# Patient Record
Sex: Male | Born: 1985 | Race: Black or African American | Hispanic: No | Marital: Married | Smoking: Never smoker
Health system: Southern US, Community
[De-identification: ages and names within clinical notes are randomized; demographics above are authoritative.]

---

## 2016-08-03 LAB — TSH: TSH: 0.01 — AB (ref <=5.90)

## 2016-12-14 LAB — TSH: TSH: 0.01 — AB (ref <=5.90)

## 2016-12-25 ENCOUNTER — Ambulatory Visit (INDEPENDENT_AMBULATORY_CARE_PROVIDER_SITE_OTHER): Payer: BLUE CROSS/BLUE SHIELD | Admitting: "Endocrinology

## 2016-12-25 VITALS — BP 138/88 | HR 62 | Wt 220.0 lb

## 2016-12-25 DIAGNOSIS — E0590 Thyrotoxicosis, unspecified without thyrotoxic crisis or storm: Secondary | ICD-10-CM | POA: Insufficient documentation

## 2016-12-25 NOTE — Progress Notes (Signed)
Subjective:    Patient ID: Nicholas Fuller, male    DOB: March 20, 1986, PCP Renaldo HarrisonAddis, [name], DO   No past medical history on file. No past surgical history on file. Social History   Social History  . Marital status: Married    Spouse name: N/A  . Number of children: N/A  . Years of education: N/A   Social History Main Topics  . Smoking status: Not on file  . Smokeless tobacco: Not on file  . Alcohol use Not on file  . Drug use: Unknown  . Sexual activity: Not on file   Other Topics Concern  . Not on file   Social History Narrative  . No narrative on file   Outpatient Encounter Prescriptions as of 12/25/2016  Medication Sig  . metoprolol succinate (TOPROL-XL) 25 MG 24 hr tablet Take 0.5 tablets by mouth daily.  . [DISCONTINUED] methimazole (TAPAZOLE) 10 MG tablet Take 2 tablets by mouth daily.   No facility-administered encounter medications on file as of 12/25/2016.    ALLERGIES: No Known Allergies  VACCINATION STATUS:  There is no immunization history on file for this patient.  HPI 31 year old male patient with medical history as above. He is being seen in consultation for hyperthyroidism requested by Dr. Earl LagosAddid. - He reports that he was diagnosed with hyperthyroidism in 2015 since when he was treated continuously with methimazole currently at 10 mg by mouth twice a day and metoprolol 12.5 mg by mouth daily. -He did have symptoms of weight loss, palpitations, tremor, sweating/heat intolerance before he was initiated on therapy. His thyroid function tests remain abnormal however, his most recent thyroid function test on 12/14/2016 showed TSH 0.01, free T4 index 3.0, total T4 10.6. His labs from 08/03/2016 was significant for suppressed TSH, free T4 index was 5.9, total T4 was 16.8. - Patient reports fatigue and fluctuating energy level. - He denies any family history of thyroid dysfunction. - He didn't have thyroid ultrasound but no thyroid uptake and scan in his  records.  Review of Systems  Constitutional: + Currently reports steady body weight., + fatigue, no subjective hyperthermia, no subjective hypothermia Eyes: no blurry vision, no xerophthalmia ENT: no sore throat, no nodules palpated in throat, no dysphagia/odynophagia, no hoarseness Cardiovascular: no Chest Pain, no Shortness of Breath, no palpitations, no leg swelling Respiratory: no cough, no SOB Gastrointestinal: no Nausea/Vomiting/Diarhhea Musculoskeletal: no muscle/joint aches Skin: no rashes Neurological: no tremors, no numbness, no tingling, no dizziness Psychiatric: no depression, no anxiety  Objective:    BP 138/88 (BP Location: Left Arm, Patient Position: Sitting, Cuff Size: Large)   Pulse 62   Wt 220 lb (99.8 kg)   SpO2 98%   Wt Readings from Last 3 Encounters:  12/25/16 220 lb (99.8 kg)    Physical Exam  Constitutional:  Slightly over weight for height, not in acute distress, normal state of mind Eyes: PERRLA, EOMI, no exophthalmos ENT: moist mucous membranes, + mild thyromegaly, no cervical lymphadenopathy Cardiovascular: normal precordial activity, Regular Rate and Rhythm, no Murmur/Rubs/Gallops Respiratory:  adequate breathing efforts, no gross chest deformity, Clear to auscultation bilaterally Gastrointestinal: abdomen soft, Non -tender, No distension, Bowel Sounds present Musculoskeletal: no gross deformities, strength intact in all four extremities Skin: moist, warm, no rashes Neurological: no tremor with outstretched hands, Deep tendon reflexes normal in all four extremities.  Recent Results (from the past 2160 hour(s))  TSH     Status: Abnormal   Collection Time: 12/14/16 12:00 AM  Result Value Ref Range  TSH 0.01 (A) 0.41 - 5.90    Comment: Free T4 index 3.0, total T4 10.6    Thyroid ultrasound from 10/17/2016 showed right lobe 5.8 cm x 2.1 cmx 3 cm. This lobe was found to be diffusely heterogeneous with nodular echotexture however no discrete focal  abnormality. 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 There is diffuse increased vascularity throughout the thyroid.  Assessment & Plan:   1. Hyperthyroidism - This patient is being seen at the kind request of Dr. Hart RobinsonsAddis. - I have reviewed his available thyroid records and clinically evaluated this patient. - Although he has responded to therapy with methimazole (has taken since 2015), he will likely require definitive ablative treatment if thyroid uptake and scan is suggestive of primary hyperthyroidism. - I have discussed is options of therapy and agrees with plan to obtain thyroid uptake and scan and he will return in 2 weeks with results. In preparation for thyroid uptake and scan, I have advised him to discontinue methimazole for 5 days. -He'll continue on low-dose metoprolol 12.5 mg by mouth daily for rate control.  - 45 minutes of time was spent on the care of this patient , 50% of which was applied for counseling on untreated  hyperthyroidism and its complications.  - I advised patient to maintain close follow up with Renaldo HarrisonAddis, [name], DO for primary care needs. Follow up plan: Return in about 2 weeks (around 01/08/2017) for follow up with thyroid uptake and scan.  Marquis LunchGebre Giovanne Nickolson, MD Phone: [phone]  Fax: [phone]   12/25/2016, 5:44 PM

## 2016-12-28 ENCOUNTER — Other Ambulatory Visit: Payer: Self-pay | Admitting: "Endocrinology

## 2016-12-28 DIAGNOSIS — E0590 Thyrotoxicosis, unspecified without thyrotoxic crisis or storm: Secondary | ICD-10-CM

## 2017-01-02 ENCOUNTER — Encounter (HOSPITAL_COMMUNITY): Payer: BLUE CROSS/BLUE SHIELD

## 2017-01-03 ENCOUNTER — Encounter (HOSPITAL_COMMUNITY): Payer: BLUE CROSS/BLUE SHIELD

## 2017-01-03 ENCOUNTER — Ambulatory Visit: Payer: BLUE CROSS/BLUE SHIELD | Admitting: "Endocrinology

## 2017-01-04 ENCOUNTER — Ambulatory Visit: Payer: BLUE CROSS/BLUE SHIELD | Admitting: "Endocrinology

## 2017-01-08 ENCOUNTER — Encounter (HOSPITAL_COMMUNITY)
Admission: RE | Admit: 2017-01-08 | Discharge: 2017-01-08 | Disposition: A | Discharge status: Home / Self Care | Payer: BLUE CROSS/BLUE SHIELD | Source: Ambulatory Visit | Attending: "Endocrinology | Admitting: "Endocrinology

## 2017-01-08 ENCOUNTER — Encounter (HOSPITAL_COMMUNITY): Payer: Self-pay

## 2017-01-08 DIAGNOSIS — E0590 Thyrotoxicosis, unspecified without thyrotoxic crisis or storm: Secondary | ICD-10-CM | POA: Insufficient documentation

## 2017-01-08 MED ORDER — SODIUM IODIDE I-123 7.4 MBQ CAPS
400.0000 | ORAL_CAPSULE | Freq: Once | ORAL | Status: AC
  Administered 2017-01-08: 364 via ORAL

## 2017-01-09 ENCOUNTER — Encounter (HOSPITAL_COMMUNITY)
Admission: RE | Admit: 2017-01-09 | Discharge: 2017-01-09 | Disposition: A | Discharge status: Home / Self Care | Payer: BLUE CROSS/BLUE SHIELD | Source: Ambulatory Visit | Attending: "Endocrinology | Admitting: "Endocrinology

## 2017-01-09 ENCOUNTER — Encounter (HOSPITAL_COMMUNITY): Payer: Self-pay

## 2017-01-09 DIAGNOSIS — E0590 Thyrotoxicosis, unspecified without thyrotoxic crisis or storm: Secondary | ICD-10-CM | POA: Diagnosis present

## 2017-01-19 ENCOUNTER — Encounter (HOSPITAL_COMMUNITY): Payer: BLUE CROSS/BLUE SHIELD

## 2017-01-30 ENCOUNTER — Ambulatory Visit (INDEPENDENT_AMBULATORY_CARE_PROVIDER_SITE_OTHER): Payer: BLUE CROSS/BLUE SHIELD | Admitting: "Endocrinology

## 2017-01-30 ENCOUNTER — Encounter: Payer: Self-pay | Admitting: "Endocrinology

## 2017-01-30 VITALS — BP 134/83 | HR 64 | Ht 69.0 in | Wt 220.0 lb

## 2017-01-30 DIAGNOSIS — E0590 Thyrotoxicosis, unspecified without thyrotoxic crisis or storm: Secondary | ICD-10-CM

## 2017-01-30 NOTE — Progress Notes (Signed)
Subjective:    Patient ID: Nicholas Fuller, male    DOB: [birth year], PCP Nicholas Fuller, Nicholas Fuller, Nicholas Fuller   History reviewed. No pertinent past medical history. History reviewed. No pertinent surgical history. Social History   Social History  . Marital status: Married    Spouse name: N/A  . Number of children: N/A  . Years of education: N/A   Social History Main Topics  . Smoking status: Never Smoker  . Smokeless tobacco: Never Used  . Alcohol use Yes     Comment: Ocassional  . Drug use: No  . Sexual activity: Not Asked   Other Topics Concern  . None   Social History Narrative  . None   Outpatient Encounter Prescriptions as of 01/30/2017  Medication Sig  . metoprolol succinate (TOPROL-XL) 25 MG 24 hr tablet Take 0.5 tablets by mouth daily.   No facility-administered encounter medications on file as of 01/30/2017.    ALLERGIES: No Known Allergies  VACCINATION STATUS:  There is no immunization history on file for this patient.  HPI 31 year old male patient with medical history as above. He is being seen in follow-up for hyperthyroidism . - He reports that he was diagnosed with hyperthyroidism in 2015 since when he was treated continuously with methimazole  until last visit. He remains on  metoprolol 12.5 mg by mouth daily. -He did have symptoms of weight loss, palpitations, tremor, sweating/heat intolerance before he was initiated on therapy. - The symptoms have largely subsided.    His thyroid function tests remain abnormal however, his most recent thyroid function test on 12/14/2016 showed TSH 0.01, free T4 index 3.0, total T4 10.6. His labs from 08/03/2016 was significant for suppressed TSH, free T4 index was 5.9, total T4 was 16.8. - His recent thyroid uptake and scan was unremarkable.  - He denies any family history of thyroid dysfunction.  Review of Systems  Constitutional: + Currently reports steady body weight., + fatigue, no subjective hyperthermia, no subjective  hypothermia Eyes: no blurry vision, no xerophthalmia ENT: no sore throat, no nodules palpated in throat, no dysphagia/odynophagia, no hoarseness Cardiovascular: no Chest Pain, no Shortness of Breath, no palpitations, no leg swelling Respiratory: no cough, no SOB Gastrointestinal: no Nausea/Vomiting/Diarhhea Musculoskeletal: no muscle/joint aches Skin: no rashes Neurological: no tremors, no numbness, no tingling, no dizziness Psychiatric: no depression, no anxiety  Objective:    BP 134/83   Pulse 64   Ht 5\' 9"  (1.753 m)   Wt 220 lb (99.8 kg)   BMI 32.49 kg/m   Wt Readings from Last 3 Encounters:  01/30/17 220 lb (99.8 kg)  12/25/16 220 lb (99.8 kg)    Physical Exam  Constitutional:  Slightly over weight for height, not in acute distress, normal state of mind Eyes: PERRLA, EOMI, no exophthalmos ENT: moist mucous membranes, + mild thyromegaly, no cervical lymphadenopathy Cardiovascular: normal precordial activity, Regular Rate and Rhythm, no Murmur/Rubs/Gallops Respiratory:  adequate breathing efforts, no gross chest deformity, Clear to auscultation bilaterally Gastrointestinal: abdomen soft, Non -tender, No distension, Bowel Sounds present Musculoskeletal: no gross deformities, strength intact in all four extremities Skin: moist, warm, no rashes Neurological: no tremor with outstretched hands, Deep tendon reflexes normal in all four extremities.  Recent Results (from the past 2160 hour(s))  TSH     Status: Abnormal   Collection Time: 12/14/16 12:00 AM  Result Value Ref Range   TSH 0.01 (A) 0.41 - 5.90    Comment: Free T4 index 3.0, total T4 10.6    Thyroid  ultrasound from 10/17/2016 showed right lobe 5.8 cm x 2.1 cmx 3 cm. This lobe was found to be diffusely heterogeneous with nodular echotexture however no discrete focal abnormality. 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 There is diffuse increased  vascularity throughout the thyroid.  01/09/2017 thyroid uptake and scan showed 26.9% (normal 10-30 percent).  Assessment & Plan:   1. Hyperthyroidism - I have reviewed his available thyroid records and clinically evaluated this patient. - He has responded to therapy with methimazole (has taken since 2015). - his thyroid uptake and scan from 01/09/2017 is unremarkable, at 26. 9% (normal 10-30 percent).  he will not require definitive ablative treatment for now. - However, he will be closely followed with repeat thyroid function test in 3 months with office visit. -He'll continue on low-dose metoprolol 12.5 mg by mouth daily for rate control.  - I advised patient to maintain close follow up with Nicholas Fuller, Nicholas Fuller for primary care needs. Follow up plan: Return in about 3 months (around 05/01/2017) for follow up with pre-visit labs.  Nicholas Lunch, MD Phone: [phone]  Fax: [phone]  This note was partially dictated with voice recognition software. Similar sounding words can be transcribed inadequately or may not  be corrected upon review.  01/30/2017, 3:41 PM

## 2017-05-01 ENCOUNTER — Ambulatory Visit: Payer: BLUE CROSS/BLUE SHIELD | Admitting: "Endocrinology

## 2017-05-02 ENCOUNTER — Encounter: Payer: Self-pay | Admitting: "Endocrinology

## 2017-07-04 ENCOUNTER — Encounter: Payer: Self-pay | Admitting: "Endocrinology

## 2017-07-04 ENCOUNTER — Ambulatory Visit: Payer: BLUE CROSS/BLUE SHIELD | Admitting: "Endocrinology

## 2017-08-03 ENCOUNTER — Other Ambulatory Visit: Payer: Self-pay | Admitting: "Endocrinology

## 2017-08-03 LAB — TSH: TSH: 0.01 — AB (ref <=5.90)

## 2017-08-05 LAB — T4, FREE: FREE T4: 4.99 ng/dL — AB (ref 0.82–1.77)

## 2017-08-05 LAB — T3, FREE: T3 FREE: 23.8 pg/mL — AB (ref 2.0–4.4)

## 2017-08-05 LAB — TSH: TSH: <0.006 u[IU]/mL — ABNORMAL LOW (ref 0.450–4.500)

## 2017-08-05 LAB — THYROGLOBULIN ANTIBODY: Thyroglobulin Antibody: 63.4 IU/mL — ABNORMAL HIGH (ref 0.0–0.9)

## 2017-08-05 LAB — THYROID PEROXIDASE ANTIBODY: THYROID PEROXIDASE ANTIBODY: 583 [IU]/mL — AB (ref 0–34)

## 2017-08-06 ENCOUNTER — Ambulatory Visit (INDEPENDENT_AMBULATORY_CARE_PROVIDER_SITE_OTHER): Payer: BLUE CROSS/BLUE SHIELD | Admitting: "Endocrinology

## 2017-08-06 ENCOUNTER — Encounter: Payer: Self-pay | Admitting: "Endocrinology

## 2017-08-06 VITALS — BP 139/83 | HR 102 | Ht 69.0 in | Wt 205.0 lb

## 2017-08-06 DIAGNOSIS — E0590 Thyrotoxicosis, unspecified without thyrotoxic crisis or storm: Secondary | ICD-10-CM | POA: Diagnosis not present

## 2017-08-06 MED ORDER — PREDNISONE 20 MG PO TABS: 20.0000 mg | ORAL_TABLET | Freq: Every day | ORAL | 0 refills | Status: DC

## 2017-08-06 MED ORDER — METOPROLOL SUCCINATE ER 25 MG PO TB24: 25.0000 mg | ORAL_TABLET | Freq: Two times a day (BID) | ORAL | 2 refills | Status: DC

## 2017-08-06 NOTE — Progress Notes (Signed)
Subjective:    Patient ID: Nicholas Fuller, male    DOB: [birth year], PCP Renaldo Harrison, DO   History reviewed. No pertinent past medical history. History reviewed. No pertinent surgical history. Social History   Socioeconomic History  . Marital status: Married    Spouse name: None  . Number of children: None  . Years of education: None  . Highest education level: None  Social Needs  . Financial resource strain: None  . Food insecurity - worry: None  . Food insecurity - inability: None  . Transportation needs - medical: None  . Transportation needs - non-medical: None  Occupational History  . None  Tobacco Use  . Smoking status: Never Smoker  . Smokeless tobacco: Never Used  Substance and Sexual Activity  . Alcohol use: Yes    Comment: Ocassional  . Drug use: No  . Sexual activity: None  Other Topics Concern  . None  Social History Narrative  . None   Outpatient Encounter Medications as of 08/06/2017  Medication Sig  . metoprolol succinate (TOPROL-XL) 25 MG 24 hr tablet Take 1 tablet (25 mg total) by mouth 2 (two) times daily.  . predniSONE (DELTASONE) 20 MG tablet Take 1 tablet (20 mg total) by mouth daily with breakfast.  . [DISCONTINUED] metoprolol succinate (TOPROL-XL) 25 MG 24 hr tablet Take 25 tablets by mouth 2 (two) times daily.   No facility-administered encounter medications on file as of 08/06/2017.    ALLERGIES: No Known Allergies  VACCINATION STATUS:  There is no immunization history on file for this patient.  HPI 32 year old male patient with medical history as above. He is being seen in follow-up for hyperthyroidism . -  - He reports that he was diagnosed with hyperthyroidism in 2015 since when he was treated continuously with methimazole until July 2018.  -Based on his thyroid uptake and scan which showed mild Graves' disease in August 2018,  thyroid  ablation was recommended.  However, patient did not show up for his procedure and did not return  for office visit.  -He noticed symptoms return including palpitations, sleep disturbance, tremor, weight loss of 15 pounds.  -He remains on Toprol 12.5 mg p.o. daily.    His most recent labs showed significantly elevated free T4 of 4.9 9, TSH suppressed at  <0.006, and significantly elevated antithyroid antibodies.   - He denies any family history of thyroid dysfunction.  Review of Systems  Constitutional: + Lost 15 pounds since last visit, + fatigue, + subjective hyperthermia Eyes: no blurry vision, no xerophthalmia ENT: no sore throat, no nodules palpated in throat, no dysphagia/odynophagia, no hoarseness Cardiovascular: no Chest Pain, no Shortness of Breath, + palpitations, no leg swelling Respiratory: no cough, no SOB Gastrointestinal: no Nausea/Vomiting/Diarhhea Musculoskeletal: no muscle/joint aches Skin: no rashes Neurological: + tremors, no numbness, no tingling, no dizziness Psychiatric: no depression, + anxiety  Objective:    BP 139/83   Pulse (!) 102   Ht 5\' 9"  (1.753 m)   Wt 205 lb (93 kg)   BMI 30.27 kg/m   Wt Readings from Last 3 Encounters:  08/06/17 205 lb (93 kg)  01/30/17 220 lb (99.8 kg)  12/25/16 220 lb (99.8 kg)    Physical Exam  Constitutional:  + Anxious state of mind, not in acute distress.   Eyes: PERRLA, EOMI, no exophthalmos ENT: moist mucous membranes, + mild thyromegaly, no cervical lymphadenopathy Cardiovascular: + Tachycardic, no Murmur/Rubs/Gallops Respiratory:  adequate breathing efforts, no gross chest deformity, Clear to auscultation  bilaterally Gastrointestinal: abdomen soft, Non -tender, No distension, Bowel Sounds present Musculoskeletal: no gross deformities, strength intact in all four extremities Skin: moist, warm, no rashes Neurological: no tremor with outstretched hands, Deep tendon reflexes normal in all four extremities.  Recent Results (from the past 2160 hour(s))  TSH     Status: Abnormal   Collection Time: 08/03/17 12:00  AM  Result Value Ref Range   TSH 0.01 (A) 0.41 - 5.90    Comment: free t4 4.99, elevated TPO 583, Tg abs 63, free t3  elev23.8    Thyroid ultrasound from 10/17/2016 showed right lobe 5.8 cm x 2.1 cmx 3 cm. This lobe was found to be diffusely heterogeneous with nodular echotexture however no discrete focal abnormality. 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 There is diffuse increased vascularity throughout the thyroid.  01/09/2017 thyroid uptake and scan showed 26.9% (normal 10-30 percent).  Assessment & Plan:   1. Hyperthyroidism -His repeat labs are consistent with significant thyroid hormone burden with free T4 of 4.99 along with undetectable TSH. -Even though he has responded to methimazole therapy in the past (2015).  - his thyroid uptake and scan from 01/09/2017 was remarkable for high normal uptake of 26.9%, patient since been symptomatic associated with elevated thyroid hormone profile, unfortunately patient did not return for a follow-up visit. -Given the fact that patient is a poor follow-up candidate, he is approached for definitive thyroid ablative treatment with I-131, and he agrees. -He understands subsequent need for thyroid hormone replacement. -Discussed and ordered I-131 thyroid ablation which would be scheduled to be done as soon as possible. -In preparation for transient severe thyrotoxicosis, I have made the following adjustments in his medications: -I advised him to increase his Toprol to 25 mg p.o. twice daily, initiated prednisone 20 mg p.o. daily for 20 days from the day before his I-131 dose. -He will return in 8 weeks with repeat thyroid function test for follow-up.  - I advised patient to maintain close follow up with Renaldo HarrisonAddis, [name], DO for primary care needs. Follow up plan: Return in about 8 weeks (around 10/01/2017) for follow up with labs after I131 therapy.  Marquis LunchGebre Nida, MD Phone:  [phone]  Fax: [phone]  This note was partially dictated with voice recognition software. Similar sounding words can be transcribed inadequately or may not  be corrected upon review.  08/06/2017, 2:13 PM

## 2017-08-14 ENCOUNTER — Encounter (HOSPITAL_COMMUNITY)
Admission: RE | Admit: 2017-08-14 | Discharge: 2017-08-14 | Disposition: A | Discharge status: Home / Self Care | Payer: BLUE CROSS/BLUE SHIELD | Source: Ambulatory Visit | Attending: "Endocrinology | Admitting: "Endocrinology

## 2017-08-14 ENCOUNTER — Encounter (HOSPITAL_COMMUNITY): Payer: Self-pay

## 2017-08-14 DIAGNOSIS — E0590 Thyrotoxicosis, unspecified without thyrotoxic crisis or storm: Secondary | ICD-10-CM | POA: Diagnosis not present

## 2017-08-14 MED ORDER — SODIUM IODIDE I 131 CAPSULE
20.0000 | Freq: Once | INTRAVENOUS | Status: AC | PRN
  Administered 2017-08-14: 21.6 via ORAL

## 2017-08-17 ENCOUNTER — Ambulatory Visit (HOSPITAL_COMMUNITY): Payer: BLUE CROSS/BLUE SHIELD

## 2017-09-05 LAB — CBC AND DIFFERENTIAL
HCT: 39 — AB (ref 41–53)
HEMOGLOBIN: 12.8 — AB (ref 13.5–17.5)
WBC: 2

## 2017-09-05 LAB — BASIC METABOLIC PANEL
BUN: 6 (ref 4–21)
Creatinine: 0.6 (ref <=1.3)

## 2017-09-05 LAB — TSH: TSH: 0.01 — AB (ref <=5.90)

## 2017-10-02 ENCOUNTER — Ambulatory Visit (INDEPENDENT_AMBULATORY_CARE_PROVIDER_SITE_OTHER): Payer: BLUE CROSS/BLUE SHIELD | Admitting: "Endocrinology

## 2017-10-02 ENCOUNTER — Encounter: Payer: Self-pay | Admitting: "Endocrinology

## 2017-10-02 VITALS — BP 137/81 | HR 71 | Ht 69.0 in | Wt 217.0 lb

## 2017-10-02 DIAGNOSIS — D72819 Decreased white blood cell count, unspecified: Secondary | ICD-10-CM | POA: Diagnosis not present

## 2017-10-02 DIAGNOSIS — E0590 Thyrotoxicosis, unspecified without thyrotoxic crisis or storm: Secondary | ICD-10-CM | POA: Diagnosis not present

## 2017-10-02 MED ORDER — METOPROLOL SUCCINATE ER 25 MG PO TB24: 25.0000 mg | ORAL_TABLET | Freq: Every day | ORAL | 6 refills | Status: AC

## 2017-10-02 NOTE — Progress Notes (Signed)
Subjective:    Patient ID: Nicholas Fuller, male    DOB: 1986/02/24, PCP Renaldo Harrison, DO   History reviewed. No pertinent past medical history. History reviewed. No pertinent surgical history. Social History   Socioeconomic History  . Marital status: Married    Spouse name: Not on file  . Number of children: Not on file  . Years of education: Not on file  . Highest education level: Not on file  Occupational History  . Not on file  Social Needs  . Financial resource strain: Not on file  . Food insecurity:    Worry: Not on file    Inability: Not on file  . Transportation needs:    Medical: Not on file    Non-medical: Not on file  Tobacco Use  . Smoking status: Never Smoker  . Smokeless tobacco: Never Used  Substance and Sexual Activity  . Alcohol use: Yes    Comment: Ocassional  . Drug use: No  . Sexual activity: Not on file  Lifestyle  . Physical activity:    Days per week: Not on file    Minutes per session: Not on file  . Stress: Not on file  Relationships  . Social connections:    Talks on phone: Not on file    Gets together: Not on file    Attends religious service: Not on file    Active member of club or organization: Not on file    Attends meetings of clubs or organizations: Not on file    Relationship status: Not on file  Other Topics Concern  . Not on file  Social History Narrative  . Not on file   Outpatient Encounter Medications as of 10/02/2017  Medication Sig  . metoprolol succinate (TOPROL-XL) 25 MG 24 hr tablet Take 1 tablet (25 mg total) by mouth daily.  . [DISCONTINUED] metoprolol succinate (TOPROL-XL) 25 MG 24 hr tablet Take 1 tablet (25 mg total) by mouth 2 (two) times daily.  . [DISCONTINUED] predniSONE (DELTASONE) 20 MG tablet Take 1 tablet (20 mg total) by mouth daily with breakfast.   No facility-administered encounter medications on file as of 10/02/2017.    ALLERGIES: No Known Allergies  VACCINATION STATUS:  There is no  immunization history on file for this patient.  HPI 32 year old male patient with medical history as above. He is being seen in follow-up for hyperthyroidism . -He is status post RAI dosing for treatment of Graves' disease on August 14, 2016. -He has responded clinically with resolution of his previous symptoms of palpitations, sleep disturbance, tremors, nervousness/anxiety.  He sleeps better.  He has more consistent energy.  He has regained 12 pounds.  He did his labs on September 05, 2017, showing TSH still suppressed. -He remains on Toprol 25 mg p.o. twice daily.    - He denies any family history of thyroid dysfunction.  Review of Systems  Constitutional: + Gained 15 pounds,  - fatigue, - subjective hyperthermia Eyes: no blurry vision, no xerophthalmia ENT: no sore throat, no nodules palpated in throat, no dysphagia/odynophagia, no hoarseness Cardiovascular: no chest pain, no palpitations.   Respiratory: no cough, no SOB Gastrointestinal: no Nausea/Vomiting/Diarhhea Musculoskeletal: no muscle/joint aches Skin: no rashes Neurological: Tremors of outstretched hands, no numbness, no tingling, no dizziness Psychiatric: no depression, no anxiety  Objective:    BP 137/81   Pulse 71   Ht  (1.753 m)   Wt 217 lb (98.4 kg)   BMI 32.05 kg/m   Wt Readings  from Last 3 Encounters:  10/02/17 217 lb (98.4 kg)  08/06/17 205 lb (93 kg)  01/30/17 220 lb (99.8 kg)    Physical Exam  Constitutional:  + stable state of mind  Eyes: PERRLA, EOMI, no exophthalmos ENT: moist mucous membranes, decreasing thyromegaly, no bruit, no cervical lymphadenopathy  Musculoskeletal: no gross deformities, strength intact in all four extremities Skin: moist, warm, no rashes Neurological: no tremor with outstretched hands, Deep tendon reflexes normal in all four extremities.  Recent Results (from the past 2160 hour(s))  TSH     Status: Abnormal   Collection Time: 08/03/17 12:00 AM  Result Value Ref Range    TSH 0.01 (A) 0.41 - 5.90    Comment: free t4 4.99, elevated TPO 583, Tg abs 63, free t3  elev23.8  T4, free     Status: Abnormal   Collection Time: 08/03/17  3:40 PM  Result Value Ref Range   Free T4 4.99 (H) 0.82 - 1.77 ng/dL  TSH     Status: Abnormal   Collection Time: 08/03/17  3:40 PM  Result Value Ref Range   TSH <0.006 (L) 0.450 - 4.500 uIU/mL  Thyroglobulin antibody     Status: Abnormal   Collection Time: 08/03/17  3:40 PM  Result Value Ref Range   Thyroglobulin Antibody 63.4 (H) 0.0 - 0.9 IU/mL    Comment: Thyroglobulin Antibody measured by Beckman Coulter Methodology  Thyroid peroxidase antibody     Status: Abnormal   Collection Time: 08/03/17  3:40 PM  Result Value Ref Range   Thyroperoxidase Ab SerPl-aCnc 583 (H) 0 - 34 IU/mL  T3, free     Status: Abnormal   Collection Time: 08/03/17  3:40 PM  Result Value Ref Range   T3, Free 23.8 (HH) 2.0 - 4.4 pg/mL  CBC and differential     Status: Abnormal   Collection Time: 09/05/17 12:00 AM  Result Value Ref Range   Hemoglobin 12.8 (A) 13.5 - 17.5   HCT 39 (A) 41 - 53   WBC 2.0   Basic metabolic panel     Status: None   Collection Time: 09/05/17 12:00 AM  Result Value Ref Range   BUN 6 4 - 21   Creatinine 0.6 0.6 - 1.3  TSH     Status: Abnormal   Collection Time: 09/05/17 12:00 AM  Result Value Ref Range   TSH 0.01 (A) 0.41 - 5.90    Thyroid ultrasound from 10/17/2016 showed right lobe 5.8 cm x 2.1 cmx 3 cm. This lobe was found to be diffusely heterogeneous with nodular echotexture however no discrete focal abnormality. 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 There is diffuse increased vascularity throughout the thyroid.  01/09/2017 thyroid uptake and scan showed 26.9% (normal 10-30 percent).  Assessment & Plan:   1. Hyperthyroidism -Status post RAI therapy on August 14, 2017.  Labs were done to close to his therapy and did not show reversal of  hyperthyroidism.  Clinically he is showing significant symptomatic improvement. -He is not ready for thyroid hormone initiation. -I discussed and plan to repeat thyroid function test in 8 weeks with office visit in 9 weeks. -I advised him to lower her Toprol to 25 mg p.o. Daily.  - I advised patient to maintain close follow up with Renaldo Harrison, DO for primary care needs.  Follow up plan: Return in about 2 months (around 12/04/2017) for follow up with pre-visit labs.  Porfirio Mylar  Fransico Him, MD Phone: [phone]  Fax: [phone]  This note was partially dictated with voice recognition software. Similar sounding words can be transcribed inadequately or may not  be corrected upon review.  10/02/2017, 10:58 AM

## 2017-10-08 ENCOUNTER — Telehealth: Payer: Self-pay | Admitting: "Endocrinology

## 2017-10-09 ENCOUNTER — Encounter: Payer: Self-pay | Admitting: "Endocrinology

## 2017-10-09 ENCOUNTER — Encounter: Payer: BLUE CROSS/BLUE SHIELD | Admitting: "Endocrinology

## 2017-10-10 NOTE — Telephone Encounter (Signed)
Opened in error

## 2017-10-10 NOTE — Progress Notes (Signed)
This encounter was created in error - please disregard.

## 2017-12-04 ENCOUNTER — Ambulatory Visit: Payer: BLUE CROSS/BLUE SHIELD | Admitting: "Endocrinology

## 2017-12-04 ENCOUNTER — Encounter: Payer: Self-pay | Admitting: "Endocrinology

## 2018-06-30 IMAGING — NM NM THYROID IMAGING W/ UPTAKE SINGLE (24 HR)
4 series · 4 of 4 positions shown · non-contrast
Comparison: None.

CLINICAL DATA: Hyperthyroidism. Heart palpitations, goiter,
sleeplessness and fatigue. Serum TSH 0.01 on 08/03/2016 and
12/14/2016.

EXAM:
THYROID SCAN AND UPTAKE - 4 AND 24 HOURS
TECHNIQUE: Following the per oral administration of I 123 sodium iodide, the
patient returned at 24 hours and uptake measurements were acquired
with the uptake probe centered on the neck. Thyroid imaging was
performed.
RADIOPHARMACEUTICALS:  364 uCi I -123

[Series 1: ant w marker · 1.18mm/px · 1 of 1 slices shown]
[im 1/1]
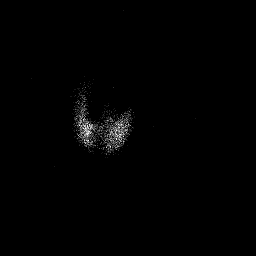

[Series 2: anterior · 1.18mm/px · 1 of 1 slices shown]
[im 1/1]
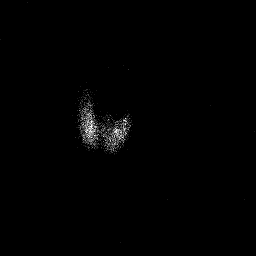

[Series 3: lao · 1.18mm/px · 1 of 1 slices shown]
[im 1/1]
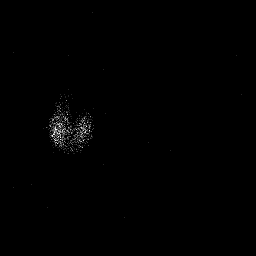

[Series 4: rao · 1.18mm/px · 1 of 1 slices shown]
[im 1/1]
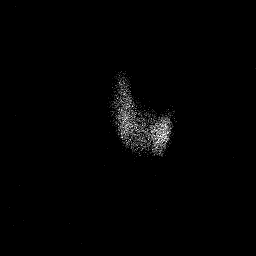

[4 of 4 positions shown; findings below may reference images not displayed]

FINDINGS: There is homogeneous thyroid uptake. No focal hot or cold nodules
are seen.

24 hour I 123 uptake = 26.9% (normal 10-30%)
IMPRESSION: Normal 24 hour thyroid uptake and scan. In the setting of a
suppressed serum TSH level, findings likely represent early/ mild
diffuse toxic goiter (Graves disease).

## 2019-02-02 IMAGING — NM NM RAI THERAPY FOR HYPERTHYROIDISM
1 series · 1 of 1 positions shown · non-contrast
Comparison: Thyroid uptake and scan 01/09/2017

CLINICAL DATA: Hyperthyroidism. Depressed TSH equal 0.01 on
08/03/2017 and 12/14/2016. Hyperthyroid symptoms include heart
palpitations, goiter, sleeveless and fatigue. Findings consistent
with early Graves disease

EXAM:
RADIOACTIVE IODINE THERAPY FOR HYPERTHYROIDISM
TECHNIQUE: Radioactive iodine prescribed by Dr. Solem. The risks and
benefits of radioactive iodine therapy were discussed with the
patient in detail by Dr. Tzoc. Alternative therapies were also
mentioned. Radiation safety was discussed with the patient,
including how to protect the general public from exposure. There
were no barriers to communication. Written consent was obtained. The
patient then received a capsule containing the radiopharmaceutical.
The patient will follow-up with the referring physician.
RADIOPHARMACEUTICALS:  21.6 mCi 9-QUQ sodium iodide orally

[Series 1: bone statics · 2.07mm/px · 1 of 1 slices shown]
[im 1/1]
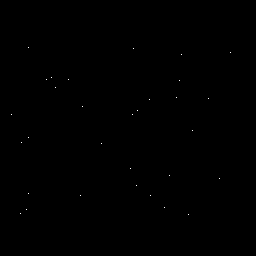

[1 of 1 positions shown; findings below may reference images not displayed]

IMPRESSION: Per oral administration of 9-QUQ sodium iodide for the treatment of
hyperthyroidism (presumed early Graves disease).
